# Patient Record
Sex: Male | Born: 1975 | Race: White | Hispanic: No | Marital: Single | Smoking: Current every day smoker
Health system: Southern US, Community
[De-identification: ages and names within clinical notes are randomized; demographics above are authoritative.]

## PROBLEM LIST (undated history)

## (undated) ENCOUNTER — Emergency Department (HOSPITAL_BASED_OUTPATIENT_CLINIC_OR_DEPARTMENT_OTHER): Discharge status: Against Medical Advice | Payer: Managed Care, Other (non HMO)

## (undated) DIAGNOSIS — G43909 Migraine, unspecified, not intractable, without status migrainosus: Secondary | ICD-10-CM

## (undated) DIAGNOSIS — G44009 Cluster headache syndrome, unspecified, not intractable: Secondary | ICD-10-CM

## (undated) HISTORY — PX: OTHER SURGICAL HISTORY: SHX169

## (undated) HISTORY — PX: LEG SURGERY: SHX1003

## (undated) HISTORY — PX: KNEE SURGERY: SHX244

## (undated) HISTORY — PX: TONSILLECTOMY: SUR1361

---

## 2009-12-28 ENCOUNTER — Emergency Department (HOSPITAL_COMMUNITY): Admission: EM | Admit: 2009-12-28 | Discharge: 2009-12-29 | Payer: Self-pay | Admitting: Emergency Medicine

## 2015-09-17 ENCOUNTER — Emergency Department (HOSPITAL_BASED_OUTPATIENT_CLINIC_OR_DEPARTMENT_OTHER)
Admission: EM | Admit: 2015-09-17 | Discharge: 2015-09-18 | Disposition: A | Discharge status: Home / Self Care | Payer: Managed Care, Other (non HMO) | Attending: Emergency Medicine | Admitting: Emergency Medicine

## 2015-09-17 ENCOUNTER — Encounter (HOSPITAL_BASED_OUTPATIENT_CLINIC_OR_DEPARTMENT_OTHER): Payer: Self-pay

## 2015-09-17 DIAGNOSIS — R51 Headache: Secondary | ICD-10-CM | POA: Diagnosis present

## 2015-09-17 DIAGNOSIS — G44019 Episodic cluster headache, not intractable: Secondary | ICD-10-CM

## 2015-09-17 DIAGNOSIS — Z720 Tobacco use: Secondary | ICD-10-CM | POA: Diagnosis not present

## 2015-09-17 DIAGNOSIS — Z79899 Other long term (current) drug therapy: Secondary | ICD-10-CM | POA: Insufficient documentation

## 2015-09-17 HISTORY — DX: Cluster headache syndrome, unspecified, not intractable: G44.009

## 2015-09-17 MED ORDER — SODIUM CHLORIDE 0.9 % IV BOLUS (SEPSIS)
1000.0000 mL | Freq: Once | INTRAVENOUS | Status: AC
  Administered 2015-09-17: 1000 mL via INTRAVENOUS

## 2015-09-17 MED ORDER — DIPHENHYDRAMINE HCL 50 MG/ML IJ SOLN
25.0000 mg | Freq: Once | INTRAMUSCULAR | Status: AC
Start: 2015-09-17 — End: 2015-09-17
  Administered 2015-09-17: 25 mg via INTRAVENOUS
  Filled 2015-09-17: qty 1

## 2015-09-17 MED ORDER — DIAZEPAM 5 MG/ML IJ SOLN
5.0000 mg | Freq: Once | INTRAMUSCULAR | Status: AC
  Administered 2015-09-17: 5 mg via INTRAVENOUS
  Filled 2015-09-17: qty 2

## 2015-09-17 MED ORDER — METOCLOPRAMIDE HCL 5 MG/ML IJ SOLN
10.0000 mg | Freq: Once | INTRAMUSCULAR | Status: AC
  Administered 2015-09-17: 10 mg via INTRAVENOUS
  Filled 2015-09-17: qty 2

## 2015-09-17 MED ORDER — SODIUM CHLORIDE 0.9 % IV SOLN
Freq: Once | INTRAVENOUS | Status: AC
  Administered 2015-09-17: 22:00:00 via INTRAVENOUS

## 2015-09-17 MED ORDER — DEXAMETHASONE SODIUM PHOSPHATE 10 MG/ML IJ SOLN
10.0000 mg | Freq: Once | INTRAMUSCULAR | Status: AC
  Administered 2015-09-17: 10 mg via INTRAVENOUS
  Filled 2015-09-17: qty 1

## 2015-09-17 NOTE — ED Provider Notes (Signed)
CSN: [id number]     Arrival date & time 09/17/15  2112 History   First MD Initiated Contact with Patient 09/17/15 2258     Chief Complaint  Patient presents with  . Headache     (Consider location/radiation/quality/duration/timing/severity/associated sxs/prior Treatment) HPI is a 39 year old male with a history of cluster headaches. He has been seen multiple hospitals in the past several days for same. He was seen earlier this evening at [facility]. The note from that visit indicates that he was told upfront that he would not be receiving narcotics and he agreed. The note indicates he subsequently became belligerent and verbally abusive with staff when he was denied Dilaudid. Since arrival here has his been belligerent with our staff demanding Dilaudid and complaining that he has had to wait. He was also seen earlier today at the Hospital in [place]. He was not given narcotics there either.  His headache began yesterday evening. It is located above his left eye and well localized. The pain is severe and consistent with previous cluster headaches. Is been associated with watering of the left eye but no photophobia. He has had nausea but no vomiting. He has taken Toradol and Imitrex without relief. He refuses subcutaneous Imitrex because "I don't like the way it makes me feel".    Past Medical History  Diagnosis Date  . Headaches, cluster    Past Surgical History  Procedure Laterality Date  . Knee surgry    . Tonsillectomy     No family history on file. Social History  Substance Use Topics  . Smoking status: Current Every Day Smoker  . Smokeless tobacco: None  . Alcohol Use: Yes     Comment: social    Review of Systems  All other systems reviewed and are negative.   Allergies  Vicodin  Home Medications   Prior to Admission medications   Medication Sig Start Date End Date Taking? Authorizing Nimah Uphoff  buPROPion (WELLBUTRIN) 75 MG tablet Take 75 mg by mouth 2 (two)  times daily.   Yes Historical Natara Monfort, MD  ketorolac (TORADOL) 10 MG tablet Take 10 mg by mouth every 6 (six) hours as needed.   Yes Historical Eather Chaires, MD  SUMAtriptan (IMITREX) 50 MG tablet Take 100 mg by mouth every 2 (two) hours as needed for migraine. May repeat in 2 hours if headache persists or recurs.   Yes Historical Cherlyn Syring, MD   BP 133/96 mmHg  Pulse 79  Temp(Src) 97.6 F (36.4 C) (Oral)  Resp 18  Ht  (1.88 m)  Wt 167 lb (75.751 kg)  BMI 21.43 kg/m2  SpO2 100%   Physical Exam  General: Well-developed, well-nourished male in no acute distress; appearance consistent with age of record HENT: normocephalic; atraumatic Eyes: pupils equal, round and reactive to light; extraocular muscles intact Neck: supple Heart: regular rate and rhythm Lungs: clear to auscultation bilaterally Abdomen: soft; nondistended Extremities: No deformity; full range of motion Neurologic: Awake, alert and oriented; motor function intact in all extremities and symmetric; no facial droop Skin: Warm and dry Psychiatric: Argumentative but not threatening    ED Course  Procedures (including critical care time)   MDM  1:13 AM Patient sleeping comfortably. Rates pain 3 of 10.  Paula Libra, MD 09/18/15 [phone]

## 2015-09-17 NOTE — ED Notes (Signed)
EDP and CN remain with pt, at [facility].  [place] PD present outside of room.

## 2015-09-17 NOTE — ED Notes (Signed)
Pt c/o cluster headaches but uncontrollable in the past 5days, was seen at [facility] x2 for the same and the keep returning; states went to [facility] and there cocktail knocked it out completely (valium, decadron, dilaudid, benadryl)

## 2015-09-17 NOTE — ED Notes (Signed)
C/o HA, onset last night, eased off, worse today, gradually progressively worse throughout the day, takes imitrex, sees Dr. Adella Hare, reports "have been given IVF and O2 today at other locations and various unhelpful HA cocktails that have not been what usually works". Alert, NAD< calm, interactive, VSS. Not sensitive to light.

## 2015-09-17 NOTE — ED Notes (Signed)
Pts family member came out of room and stated to nurse that somebody better go in there and see him pretty soon, and she went out of the ED.  I went in the room to see pt and he said he was ready to "get all this stuff off and go home."  Pt expressed frustration that he has not seen a doctor yet and has not gotten "the medicine that I know works". Pt sts "everybody wants to try something else but I know what works."  Pt requesting to know when the doctor is coming in to see him.  Informed him that I cannot tell him that.  Pt has decided to stay at this time.

## 2015-09-17 NOTE — ED Notes (Signed)
Dr. Read Drivers into room with CN. [place] [employer] outside of room, security present.

## 2015-09-18 NOTE — Discharge Instructions (Signed)
Cluster Headache Cluster headaches are recognized by their pattern of deep, intense head pain. They normally occur on one side of your head, but they may "switch sides" in subsequent episodes. Typically, cluster headaches:   Are severe in nature.   Occur repeatedly over weeks to months and are followed by periods of no headaches.   Can last from 15 minutes to 3 hours.   Occur at the same time each day, often at night.   Occur several times a day. CAUSES The exact cause of cluster headaches is not known. Alcohol use may be associated with cluster headaches. SIGNS AND SYMPTOMS   Severe pain that begins in or around your eye or temple.   One-sided head pain.   Feeling sick to your stomach (nauseous).   Sensitivity to light.   Runny nose.   Eye redness, tearing, and nasal stuffiness on the side of your head where you are experiencing pain.   Sweaty, pale skin of the face.   Droopy or swollen eyelid.   Restlessness. DIAGNOSIS  Cluster headaches are diagnosed based on symptoms and a physical exam. Your health care provider may order a CT scan or an MRI of your head or lab tests to see if your headaches are caused by other medical conditions.  TREATMENT   Medicines for pain relief and to prevent recurrent attacks. Some people may need a combination of medicines.  Oxygen for pain relief.   Biofeedback programs to help reduce headache pain.  It may be helpful to keep a headache diary. This may help you find a trend for what is triggering your headaches. Your health care provider can develop a treatment plan.  HOME CARE INSTRUCTIONS  During cluster periods:   Follow a regular sleep schedule. Do not vary the amount and time that you sleep from day to day. It is important to stay on the same schedule during a cluster period to help prevent headaches.   Avoid alcohol.   Stop smoking if you smoke.  SEEK MEDICAL CARE IF:  You have any changes from your previous  cluster headaches either in intensity or frequency.   You are not getting relief from medicines you are taking.  SEEK IMMEDIATE MEDICAL CARE IF:   You faint.   You have weakness or numbness, especially on one side of your body or face.   You have double vision.   You have nausea or vomiting that is not relieved within several hours.   You cannot keep your balance or have difficulty talking or walking.   You have neck pain or stiffness.   You have a fever. MAKE SURE YOU:  Understand these instructions.   Will watch your condition.   Will get help right away if you are not doing well or get worse. Document Released: 12/09/2005 Document Revised: 09/29/2013 Document Reviewed: 07/01/2013 Exit[employer] Patient Information 2015 [employer]. This information is not intended to replace advice given to you by your health care provider. Make sure you discuss any questions you have with your health care provider.  

## 2017-02-16 ENCOUNTER — Emergency Department (HOSPITAL_BASED_OUTPATIENT_CLINIC_OR_DEPARTMENT_OTHER)
Admission: EM | Admit: 2017-02-16 | Discharge: 2017-02-16 | Disposition: A | Discharge status: Home / Self Care | Payer: Self-pay | Attending: Emergency Medicine | Admitting: Emergency Medicine

## 2017-02-16 ENCOUNTER — Encounter (HOSPITAL_BASED_OUTPATIENT_CLINIC_OR_DEPARTMENT_OTHER): Payer: Self-pay | Admitting: Emergency Medicine

## 2017-02-16 ENCOUNTER — Emergency Department (HOSPITAL_BASED_OUTPATIENT_CLINIC_OR_DEPARTMENT_OTHER): Payer: Self-pay

## 2017-02-16 DIAGNOSIS — Z791 Long term (current) use of non-steroidal anti-inflammatories (NSAID): Secondary | ICD-10-CM | POA: Insufficient documentation

## 2017-02-16 DIAGNOSIS — R0789 Other chest pain: Secondary | ICD-10-CM

## 2017-02-16 DIAGNOSIS — F17210 Nicotine dependence, cigarettes, uncomplicated: Secondary | ICD-10-CM | POA: Insufficient documentation

## 2017-02-16 LAB — COMPREHENSIVE METABOLIC PANEL
ALK PHOS: 118 U/L (ref 38–126)
ALT: 345 U/L — ABNORMAL HIGH (ref 17–63)
AST: 158 U/L — AB (ref 15–41)
Albumin: 3.6 g/dL (ref 3.5–5.0)
Anion gap: 10 (ref 5–15)
BILIRUBIN TOTAL: 0.9 mg/dL (ref 0.3–1.2)
BUN: 11 mg/dL (ref 6–20)
CO2: 24 mmol/L (ref 22–32)
CREATININE: 1.32 mg/dL — AB (ref 0.61–1.24)
Calcium: 8.8 mg/dL — ABNORMAL LOW (ref 8.9–10.3)
Chloride: 102 mmol/L (ref 101–111)
GFR calc Af Amer: >60 mL/min (ref >60)
GFR calc non Af Amer: >60 mL/min (ref >60)
GLUCOSE: 115 mg/dL — AB (ref 65–99)
Potassium: 3.7 mmol/L (ref 3.5–5.1)
Sodium: 136 mmol/L (ref 135–145)
TOTAL PROTEIN: 7.5 g/dL (ref 6.5–8.1)

## 2017-02-16 LAB — CBC WITH DIFFERENTIAL/PLATELET
BASOS ABS: 0 10*3/uL (ref 0.0–0.1)
Basophils Relative: 0 %
EOS PCT: 1 %
Eosinophils Absolute: 0.1 10*3/uL (ref 0.0–0.7)
HCT: 44.7 % (ref 39.0–52.0)
Hemoglobin: 15.5 g/dL (ref 13.0–17.0)
Lymphocytes Relative: 21 %
Lymphs Abs: 2 10*3/uL (ref 0.7–4.0)
MCH: 34.2 pg — ABNORMAL HIGH (ref 26.0–34.0)
MCHC: 34.7 g/dL (ref 30.0–36.0)
MCV: 98.7 fL (ref 78.0–100.0)
MONO ABS: 1 10*3/uL (ref 0.1–1.0)
Monocytes Relative: 10 %
Neutro Abs: 6.4 10*3/uL (ref 1.7–7.7)
Neutrophils Relative %: 68 %
PLATELETS: 250 10*3/uL (ref 150–400)
RBC: 4.53 MIL/uL (ref 4.22–5.81)
RDW: 12.8 % (ref 11.5–15.5)
WBC: 9.4 10*3/uL (ref 4.0–10.5)

## 2017-02-16 LAB — TROPONIN I: Troponin I: <0.03 ng/mL (ref <0.03)

## 2017-02-16 MED ORDER — HYDROMORPHONE HCL 1 MG/ML IJ SOLN
1.0000 mg | Freq: Once | INTRAMUSCULAR | Status: AC
  Administered 2017-02-16: 1 mg via INTRAVENOUS
  Filled 2017-02-16: qty 1

## 2017-02-16 MED ORDER — CYCLOBENZAPRINE HCL 10 MG PO TABS: 10.0000 mg | ORAL_TABLET | Freq: Two times a day (BID) | ORAL | 0 refills | Status: AC | PRN

## 2017-02-16 MED ORDER — DICLOFENAC SODIUM 50 MG PO TBEC: 50.0000 mg | DELAYED_RELEASE_TABLET | Freq: Two times a day (BID) | ORAL | 0 refills | Status: AC

## 2017-02-16 MED ORDER — ONDANSETRON HCL 4 MG/2ML IJ SOLN
4.0000 mg | Freq: Once | INTRAMUSCULAR | Status: AC
  Administered 2017-02-16: 4 mg via INTRAVENOUS
  Filled 2017-02-16: qty 2

## 2017-02-16 MED ORDER — CYCLOBENZAPRINE HCL 5 MG PO TABS
5.0000 mg | ORAL_TABLET | Freq: Once | ORAL | Status: AC
  Administered 2017-02-16: 5 mg via ORAL
  Filled 2017-02-16: qty 1

## 2017-02-16 MED ORDER — KETOROLAC TROMETHAMINE 15 MG/ML IJ SOLN
15.0000 mg | Freq: Once | INTRAMUSCULAR | Status: AC
  Administered 2017-02-16: 15 mg via INTRAVENOUS
  Filled 2017-02-16: qty 1

## 2017-02-16 NOTE — Discharge Instructions (Signed)
Your chest x-ray and study to look at your heart are normal.  Your live function study may be elevated due to the alcohol you drink. You need to see your family doctor for follow up of your elevated liver enzymes and your chest wall pain. Take the medications as directed. The muscle relaxant can make you sleepy so do not drive or do any activity that may cause injury while taking the medication.   If you do not have a primary care doctor I have given you the name of one of our doctors here for follow up.

## 2017-02-16 NOTE — ED Triage Notes (Signed)
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 SOB

## 2017-02-16 NOTE — ED Notes (Signed)
Hope, NP in room with pt now.

## 2017-02-16 NOTE — ED Notes (Signed)
Pt on cardiac monitor and automatic VS 

## 2017-02-16 NOTE — ED Provider Notes (Signed)
[facility]   CSN: [id number] Arrival date & time: 02/16/17  1332     History   Chief Complaint Chief Complaint  Patient presents with  . Chest Pain    HPI Eric Meyers is a 41 y.o. male who presents to the ED with chest pain. 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 After working 2 days ago he came home and went to bed. He reports waking yesterday with left side chest and rib pain. The pain increases with deep breath and movement. He has taken nothing for pain. Patient reports that when the pain gets bad he gets nauseated and vomits. He smokes a pack per day. He drinks alcohol every day. Patient states "I drink more than I should".  HPI  Past Medical History:  Diagnosis Date  . Headaches, cluster     There are no active problems to display for this patient.   Past Surgical History:  Procedure Laterality Date  . knee surgry    . TONSILLECTOMY         Home Medications    Prior to Admission medications   Medication Sig Start Date End Date Taking? Authorizing Provider  buPROPion (WELLBUTRIN) 75 MG tablet Take 75 mg by mouth 2 (two) times daily.    Historical Provider, MD  cyclobenzaprine (FLEXERIL) 10 MG tablet Take 1 tablet (10 mg total) by mouth 2 (two) times daily as needed for muscle spasms. 02/16/17   Hope Orlene OchM Neese, NP  diclofenac (VOLTAREN) 50 MG EC tablet Take 1 tablet (50 mg total) by mouth 2 (two) times daily. 02/16/17   Hope Orlene OchM Neese, NP  ketorolac (TORADOL) 10 MG tablet Take 10 mg by mouth every 6 (six) hours as needed.    Historical Provider, MD  SUMAtriptan (IMITREX) 50 MG tablet Take 100 mg by mouth every 2 (two) hours as needed for migraine. May repeat in 2 hours if headache persists or recurs.    Historical Provider, MD    Family History No family history on file.  Social History Social History  Substance Use Topics  . Smoking status: Current Every Day Smoker  . Smokeless tobacco: Never Used  .  Alcohol use Yes     Comment: social     Allergies   Vicodin [hydrocodone-acetaminophen]   Review of Systems Review of Systems  Constitutional: Negative for chills and fever.  HENT: Positive for congestion. Negative for ear pain and sore throat.   Eyes: Negative for redness, itching and visual disturbance.  Respiratory: Negative for shortness of breath.        Patient states not short of breath but doesn't want to take a deep breath due to pain.  Gastrointestinal: Positive for nausea and vomiting. Negative for abdominal pain.       N/v due to when the pain is bad  Genitourinary: Negative for dysuria, frequency and hematuria.  Musculoskeletal: Positive for back pain.  Skin: Negative for wound.  Neurological: Negative for dizziness, syncope and headaches.  Psychiatric/Behavioral: Negative for confusion.     Physical Exam Updated Vital Signs BP (!) 140/103   Pulse 90   Temp 98.5 F (36.9 C) (Oral)   Resp 16   Ht 6\' 2"  (1.88 m)   Wt 78 kg   SpO2 100%   BMI 22.08 kg/m   Physical Exam  Constitutional: He is oriented to person, place, and time. He appears well-developed and well-nourished. No distress.  HENT:  Head: Normocephalic and atraumatic.  Eyes:  EOM are normal.  Neck: Normal range of motion. Neck supple.  Cardiovascular: Normal rate.   Pulmonary/Chest: No respiratory distress. He has no wheezes. He has no rales. He exhibits tenderness.    Tender with palpation left anterior ribs.  Abdominal: Soft. Bowel sounds are normal. There is no tenderness.  Musculoskeletal: He exhibits no edema.  Neurological: He is alert and oriented to person, place, and time. No cranial nerve deficit.  Skin: Skin is warm and dry.  Psychiatric: He has a normal mood and affect. His behavior is normal.  Nursing note and vitals reviewed.    ED Treatments / Results  Labs (all labs ordered are listed, but only abnormal results are displayed) Labs Reviewed  COMPREHENSIVE METABOLIC PANEL  - Abnormal; Notable for the following:       Result Value   Glucose, Bld 115 (*)    Creatinine, Ser 1.32 (*)    Calcium 8.8 (*)    AST 158 (*)    ALT 345 (*)    All other components within normal limits  CBC WITH DIFFERENTIAL/PLATELET - Abnormal; Notable for the following:    MCH 34.2 (*)    All other components within normal limits  TROPONIN I    EKG  EKG Interpretation  Date/Time:  Sunday February 16 2017 13:42:50 EST Ventricular Rate:  106 PR Interval:  120 QRS Duration: 94 QT Interval:  318 QTC Calculation: 422 R Axis:   89 Text Interpretation:  Sinus tachycardia Minimal voltage criteria for LVH, may be normal variant Borderline ECG agree. 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 SOB EXAM: CHEST  2 VIEW COMPARISON:  None. FINDINGS: The heart size and mediastinal contours are within normal limits. Both lungs are clear. No pleural effusion or pneumothorax. The visualized skeletal structures are unremarkable. IMPRESSION: No active cardiopulmonary disease. Electronically Signed   By: Amie Portland M.D.   On: 02/16/2017 14:26    Procedures Procedures (including critical care time)  Medications Ordered in ED Medications  ondansetron (ZOFRAN) injection 4 mg (4 mg Intravenous Given 02/16/17 1528)  HYDROmorphone (DILAUDID) injection 1 mg (1 mg Intravenous Given 02/16/17 1528)  ketorolac (TORADOL) 15 MG/ML injection 15 mg (15 mg Intravenous Given 02/16/17 1627)  cyclobenzaprine (FLEXERIL) tablet 5 mg (5 mg Oral Given 02/16/17 1626)     Initial Impression / Assessment and Plan / ED Course  I have reviewed the triage vital signs and the nursing notes.  Pertinent labs & imaging results that were available during my care of the  patient were reviewed by me and considered in my medical decision making (see chart for details).    Final Clinical Impressions(s) / ED Diagnoses  41 y.o. male with left side chest wall pain after moving and lifting heavy equipment stable for d/c with normal EKG, chest x-ray and troponin. Discussed in detail with the patient elevated LFT's and need for follow up. Discussed alcohol use. Patient agrees to f/u.  Final diagnoses:  Chest wall pain    New Prescriptions New Prescriptions   CYCLOBENZAPRINE (FLEXERIL) 10 MG TABLET    Take 1 tablet (10 mg total) by mouth 2 (two) times daily as needed for muscle spasms.   DICLOFENAC (VOLTAREN) 50 MG EC  TABLET    Take 1 tablet (50 mg total) by mouth 2 (two) times daily.     [address] [place], [place] 02/16/17 1638    Arby Barrette, MD 02/17/17 [phone]

## 2017-05-11 ENCOUNTER — Emergency Department (HOSPITAL_BASED_OUTPATIENT_CLINIC_OR_DEPARTMENT_OTHER)
Admission: EM | Admit: 2017-05-11 | Discharge: 2017-05-11 | Disposition: A | Discharge status: Home / Self Care | Payer: Managed Care, Other (non HMO) | Attending: Emergency Medicine | Admitting: Emergency Medicine

## 2017-05-11 ENCOUNTER — Encounter (HOSPITAL_BASED_OUTPATIENT_CLINIC_OR_DEPARTMENT_OTHER): Payer: Self-pay | Admitting: *Deleted

## 2017-05-11 ENCOUNTER — Emergency Department (HOSPITAL_BASED_OUTPATIENT_CLINIC_OR_DEPARTMENT_OTHER): Payer: Managed Care, Other (non HMO)

## 2017-05-11 DIAGNOSIS — Z791 Long term (current) use of non-steroidal anti-inflammatories (NSAID): Secondary | ICD-10-CM | POA: Insufficient documentation

## 2017-05-11 DIAGNOSIS — Y999 Unspecified external cause status: Secondary | ICD-10-CM | POA: Insufficient documentation

## 2017-05-11 DIAGNOSIS — Y929 Unspecified place or not applicable: Secondary | ICD-10-CM | POA: Insufficient documentation

## 2017-05-11 DIAGNOSIS — F17200 Nicotine dependence, unspecified, uncomplicated: Secondary | ICD-10-CM | POA: Insufficient documentation

## 2017-05-11 DIAGNOSIS — W19XXXA Unspecified fall, initial encounter: Secondary | ICD-10-CM

## 2017-05-11 DIAGNOSIS — M25531 Pain in right wrist: Secondary | ICD-10-CM | POA: Insufficient documentation

## 2017-05-11 DIAGNOSIS — M25561 Pain in right knee: Secondary | ICD-10-CM | POA: Insufficient documentation

## 2017-05-11 DIAGNOSIS — W010XXA Fall on same level from slipping, tripping and stumbling without subsequent striking against object, initial encounter: Secondary | ICD-10-CM | POA: Insufficient documentation

## 2017-05-11 DIAGNOSIS — Y9301 Activity, walking, marching and hiking: Secondary | ICD-10-CM | POA: Insufficient documentation

## 2017-05-11 DIAGNOSIS — Z79899 Other long term (current) drug therapy: Secondary | ICD-10-CM | POA: Insufficient documentation

## 2017-05-11 HISTORY — DX: Migraine, unspecified, not intractable, without status migrainosus: G43.909

## 2017-05-11 MED ORDER — NAPROXEN 500 MG PO TABS: 500.0000 mg | ORAL_TABLET | Freq: Two times a day (BID) | ORAL | 0 refills | Status: AC

## 2017-05-11 MED ORDER — OXYCODONE-ACETAMINOPHEN 5-325 MG PO TABS
1.0000 | ORAL_TABLET | Freq: Once | ORAL | Status: AC
  Administered 2017-05-11: 1 via ORAL
  Filled 2017-05-11: qty 1

## 2017-05-11 NOTE — Discharge Instructions (Signed)
Return to the ED with any concerns including increased pain, swelling/discoloration/numbness of fingers or toes on right hand or right foot, or any other alarming symptoms

## 2017-05-11 NOTE — ED Provider Notes (Signed)
[facility] Provider Note   CSN: [id number] Arrival date & time: 05/11/17  [id number]  By signing my name below, I, Eric Meyers, attest that this documentation has been prepared under the direction and in the presence of Eric Meyers, Bassheva Flury, MD. Electronically Signed: Modena JanskyAlbert Meyers, Scribe. 05/11/2017. 7:34 PM.  History   Chief Complaint Chief Complaint  Patient presents with  . Knee Pain  . Wrist Injury   The history is provided by the patient. No language interpreter was used.  Knee Pain   This is a new problem. The current episode started 12 to 24 hours ago. The problem occurs constantly. The problem has not changed since onset.The pain is present in the right wrist and right knee. The pain is moderate.  Wrist Injury     HPI Comments: Eric Meyers is a 41 y.o. male who presents to the Emergency Department complaining of constant moderate right knee/wrist pain that started last night. He states he was hiking in the dark when he stepped on a rock that caused him to fall onto his right knee and extended wrist. No LOC. His pain is exacerbated by movement. 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 Denies any gait problem, or other complaints at this time.   Past Medical History:  Diagnosis Date  . Headaches, cluster   . Migraines     There are no active problems to display for this patient.   Past Surgical History:  Procedure Laterality Date  . knee surgry    . TONSILLECTOMY         Home Medications    Prior to Admission medications   Medication Sig Start Date End Date Taking? Authorizing Provider  buPROPion (WELLBUTRIN) 75 MG tablet Take 75 mg by mouth 2 (two) times daily.    [provider]  cyclobenzaprine (FLEXERIL) 10 MG tablet Take 1 tablet (10 mg total) by mouth 2 (two) times daily as needed for muscle spasms. 02/16/17   Eric Meyers, Eric M, NP  diclofenac (VOLTAREN) 50 MG EC tablet Take 1 tablet (50 mg total) by mouth 2 (two)  times daily. 02/16/17   Eric Meyers, Eric M, NP  ketorolac (TORADOL) 10 MG tablet Take 10 mg by mouth every 6 (six) hours as needed.    [provider]  naproxen (NAPROSYN) 500 MG tablet Take 1 tablet (500 mg total) by mouth 2 (two) times daily. 05/11/17   Eric Meyers, Eric Duque, MD  SUMAtriptan (IMITREX) 50 MG tablet Take 100 mg by mouth every 2 (two) hours as needed for migraine. May repeat in 2 hours if headache persists or recurs.    [provider]    Family History No family history on file.  Social History Social History  Substance Use Topics  . Smoking status: Current Every Day Smoker  . Smokeless tobacco: Never Used  . Alcohol use Yes     Comment: social     Allergies   Vicodin [hydrocodone-acetaminophen]   Review of Systems Review of Systems  Musculoskeletal: Positive for arthralgias and myalgias. Negative for gait problem.  Neurological: Negative for syncope.  ROS reviewed and all otherwise negative except for mentioned in HPI   Physical Exam Updated Vital Signs BP (!) 160/103 (BP Location: Left Arm)   Pulse (!) 102   Temp 98.8 F (37.1 C) (Oral)   Resp 16   Wt 168 lb (76.2 kg)   SpO2 95%   BMI 21.57 kg/Meyers  Vitals reviewed Physical Exam Physical Examination: General appearance -  alert, well appearing, and in no distress Mental status - alert, oriented to person, place, and time Eyes - no conjunctival injection, no scleral icterus Head- NCAT Neck - no midline tenderness to palpation Chest - clear to auscultation, no wheezes, rales or rhonchi, symmetric air entry Heart - normal rate, regular rhythm, normal S1, S2, no murmurs, rubs, clicks or gallops Back exam -no midline tenderness to palpation Neurological - alert, oriented, normal speech, sensation and strength intact distally in right uppper and lower extremity Musculoskeletal - ttp over dorsal/ulnar aspect of right wrist, no anatomic snuffbox tenderness, right hand/fingers distally NVI.  ttp over  medial joint line of right knee, negative anterior drawer sign, no patellar or proximal fibular tenderness, foot/leg is distally NVI. Extremities - peripheral pulses normal, no pedal edema, no clubbing or cyanosis Skin - normal coloration and turgor, no rashes  ED Treatments / Results  DIAGNOSTIC STUDIES: Oxygen Saturation is 95% on RA, normal by my interpretation.    COORDINATION OF CARE: 7:38 PM- Pt advised of plan for treatment and pt agrees.  Labs (all labs ordered are listed, but only abnormal results are displayed) Labs Reviewed - No data to display  EKG  EKG Interpretation None       Radiology No results found.  Procedures Procedures (including critical care time)  Medications Ordered in ED Medications  oxyCODONE-acetaminophen (PERCOCET/ROXICET) 5-325 MG per tablet 1 tablet (1 tablet Oral Given 05/11/17 1951)     Initial Impression / Assessment and Plan / ED Course  I have reviewed the triage vital signs and the nursing notes.  Pertinent labs & imaging results that were available during my care of the patient were reviewed by me and considered in my medical decision making (see chart for details).     Pt presenting with pain in right wrist and right knee after fall.  xrays of wrist and knee show no fracture.  Due to bony point tenderness will place in knee immobilizer and wrist splint and have patient follow up with ortho for further management and re-evaluation.  Pt given pain meds in the ED and discharged with rx for anti-inflammatories.  Discharged with strict return precautions.  Pt agreeable with plan.  Final Clinical Impressions(s) / ED Diagnoses   Final diagnoses:  Right knee pain, unspecified chronicity  Right wrist pain  Fall, initial encounter    New Prescriptions Discharge Medication List as of 05/11/2017  7:56 PM    START taking these medications   Details  naproxen (NAPROSYN) 500 MG tablet Take 1 tablet (500 mg total) by mouth 2 (two) times  daily., Starting Sun 05/11/2017, Print       I personally performed the services described in this documentation, which was scribed in my presence. The recorded information has been reviewed and is accurate.      Eric Scott, MD 05/14/17 [phone]

## 2017-05-11 NOTE — ED Triage Notes (Signed)
Pt fell last pm and struck his right knee (has had previous surgery in this knee) and right wrist.  Pt reports pain in both knee and in right wrist.  Pt is ambulatory, has knee sleeve on for support.

## 2017-08-24 ENCOUNTER — Encounter (HOSPITAL_BASED_OUTPATIENT_CLINIC_OR_DEPARTMENT_OTHER): Payer: Self-pay | Admitting: *Deleted

## 2017-08-24 ENCOUNTER — Emergency Department (HOSPITAL_BASED_OUTPATIENT_CLINIC_OR_DEPARTMENT_OTHER)
Admission: EM | Admit: 2017-08-24 | Discharge: 2017-08-24 | Disposition: A | Discharge status: Home / Self Care | Payer: Managed Care, Other (non HMO) | Attending: Emergency Medicine | Admitting: Emergency Medicine

## 2017-08-24 DIAGNOSIS — R0789 Other chest pain: Secondary | ICD-10-CM | POA: Insufficient documentation

## 2017-08-24 DIAGNOSIS — R51 Headache: Secondary | ICD-10-CM | POA: Insufficient documentation

## 2017-08-24 DIAGNOSIS — Z5321 Procedure and treatment not carried out due to patient leaving prior to being seen by health care provider: Secondary | ICD-10-CM | POA: Insufficient documentation

## 2017-08-24 DIAGNOSIS — M549 Dorsalgia, unspecified: Secondary | ICD-10-CM | POA: Insufficient documentation

## 2017-08-24 DIAGNOSIS — M542 Cervicalgia: Secondary | ICD-10-CM | POA: Insufficient documentation

## 2017-08-24 NOTE — ED Notes (Signed)
aclled x 3 - no longer seen in waiting room

## 2017-08-24 NOTE — ED Notes (Signed)
Called x1

## 2017-08-24 NOTE — ED Notes (Signed)
Called x2
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
## 2018-07-28 ENCOUNTER — Emergency Department (HOSPITAL_BASED_OUTPATIENT_CLINIC_OR_DEPARTMENT_OTHER)
Admission: EM | Admit: 2018-07-28 | Discharge: 2018-07-28 | Disposition: A | Discharge status: Home / Self Care | Payer: Managed Care, Other (non HMO) | Attending: Emergency Medicine | Admitting: Emergency Medicine

## 2018-07-28 ENCOUNTER — Other Ambulatory Visit: Payer: Self-pay

## 2018-07-28 ENCOUNTER — Encounter (HOSPITAL_BASED_OUTPATIENT_CLINIC_OR_DEPARTMENT_OTHER): Payer: Self-pay

## 2018-07-28 ENCOUNTER — Emergency Department (HOSPITAL_BASED_OUTPATIENT_CLINIC_OR_DEPARTMENT_OTHER): Payer: Managed Care, Other (non HMO)

## 2018-07-28 DIAGNOSIS — M79671 Pain in right foot: Secondary | ICD-10-CM

## 2018-07-28 DIAGNOSIS — F17210 Nicotine dependence, cigarettes, uncomplicated: Secondary | ICD-10-CM | POA: Insufficient documentation

## 2018-07-28 MED ORDER — INDOMETHACIN 50 MG PO CAPS: 50.0000 mg | ORAL_CAPSULE | Freq: Two times a day (BID) | ORAL | 0 refills | Status: AC

## 2018-07-28 MED ORDER — TRAMADOL HCL 50 MG PO TABS: 50.0000 mg | ORAL_TABLET | Freq: Four times a day (QID) | ORAL | 0 refills | Status: AC | PRN

## 2018-07-28 MED ORDER — PREDNISONE 20 MG PO TABS: 40.0000 mg | ORAL_TABLET | Freq: Every day | ORAL | 0 refills | Status: AC

## 2018-07-28 MED FILL — traMADol HCL 50 MG TABS: 50 | 3 days supply | Qty: 12 | Fill #0

## 2018-07-28 MED FILL — INDOMETHACIN 50 MG CAPSULE: 50 | 5 days supply | Qty: 10 | Fill #0

## 2018-07-28 MED FILL — predniSONE 20 MG TABS: 20 | 5 days supply | Qty: 10 | Fill #0

## 2018-07-28 NOTE — ED Notes (Signed)
ED Provider at bedside. 
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
## 2018-07-28 NOTE — ED Notes (Signed)
Pain to right joint of his great toe

## 2018-07-28 NOTE — ED Notes (Signed)
Pt teaching provided on medications that may cause drowsiness. Pt instructed not to drive or operate heavy machinery while taking the prescribed medication. Pt verbalized understanding.   

## 2018-07-28 NOTE — ED Provider Notes (Signed)
Emergency Department Provider Note   I have reviewed the triage vital signs and the nursing notes.   HISTORY  Chief Complaint Foot Pain   HPI Eric Meyers is a 42 y.o. male presents to the ed with right foot pain. Patient with 1-2 weeks of pain. Initially started as pain and redness in the MTP on the right. He has been to an outside ED twice with Rx for NSAIDs with no relief. No fever or chills. No new injury. He went back to work but had increased pain and had to leave early. No radiation or symptoms or modifying factors.    Past Medical History:  Diagnosis Date  . Headaches, cluster   . Migraines     There are no active problems to display for this patient.   Past Surgical History:  Procedure Laterality Date  . KNEE SURGERY    . knee surgry    . LEG SURGERY    . TONSILLECTOMY      Allergies Patient has no active allergies.  No family history on file.  Social History Social History   Tobacco Use  . Smoking status: Current Every Day Smoker    Types: Cigarettes  . Smokeless tobacco: Never Used  Substance Use Topics  . Alcohol use: Not Currently  . Drug use: No    Review of Systems  Constitutional: No fever/chills Musculoskeletal: Negative for back pain. Positive foot pain.  Skin: Negative for rash. Neurological: Negative for headaches, focal weakness or numbness.   10-point ROS otherwise negative.  ____________________________________________   PHYSICAL EXAM:  VITAL SIGNS: ED Triage Vitals  Enc Vitals Group     BP 07/28/18 1153 (!) 145/92     Pulse Rate 07/28/18 1153 96     Resp 07/28/18 1153 20     Temp 07/28/18 1153 98.3 F (36.8 C)     Temp Source 07/28/18 1153 Oral     SpO2 07/28/18 1153 100 %     Weight 07/28/18 1153 170 lb (77.1 kg)     Height 07/28/18 1153 6\' 2"  (1.88 m)     Pain Score 07/28/18 1159 3   Constitutional: Alert and oriented. Well appearing and in no acute distress. Eyes: Conjunctivae are normal.  Head:  Atraumatic. Nose: No congestion/rhinnorhea. Mouth/Throat: Mucous membranes are moist.  Neck: No stridor.   Cardiovascular:  Good peripheral circulation. Respiratory: Normal respiratory effort.   Gastrointestinal: No distention.  Musculoskeletal: No lower extremity tenderness nor edema. No gross deformities of extremities. Mild tenderness over the 1st MTP joint. No redness or warmth. No cellulitis.  Neurologic:  Normal speech and language. No gross focal neurologic deficits are appreciated.  Skin:  Skin is warm, dry and intact. No rash noted.  ____________________________________________  RADIOLOGY  Dg Foot Complete Right  Result Date: 07/28/2018 CLINICAL DATA:  Pt rolled a log over his foot 2 weeks ago,pain and swelling across the top of his foot,pt went to another hospital twice and they took xray's and gave him anti-inflammatories and muscle relaxants. Still has pain across the top of the foot. EXAM: RIGHT FOOT COMPLETE - 3+ VIEW COMPARISON:  07/16/2018 FINDINGS: There is no evidence of fracture or dislocation. There is no evidence of arthropathy or other focal bone abnormality. Soft tissues are unremarkable. IMPRESSION: Negative. Electronically Signed   By: Norva PavlovElizabeth  Brown M.D.   On: 07/28/2018 12:39    ____________________________________________   PROCEDURES  Procedure(s) performed:   Procedures  None ____________________________________________   INITIAL IMPRESSION / ASSESSMENT AND PLAN /  ED COURSE  Pertinent labs & imaging results that were available during my care of the patient were reviewed by me and considered in my medical decision making (see chart for details).  Patient with right toe/foot pain. Plain film with no evidence of fracture. Plan for indomethacin, steroid, and pain medication. Plan for podiatry f/u.   At this time, I do not feel there is any life-threatening condition present. I have reviewed and discussed all results (EKG, imaging, lab, urine as  appropriate), exam findings with patient. I have reviewed nursing notes and appropriate previous records.  I feel the patient is safe to be discharged home without further emergent workup. Discussed usual and customary return precautions. Patient and family (if present) verbalize understanding and are comfortable with this plan.  Patient will follow-up with their primary care provider. If they do not have a primary care provider, information for follow-up has been provided to them. All questions have been answered.  ____________________________________________  FINAL CLINICAL IMPRESSION(S) / ED DIAGNOSES  Final diagnoses:  Right foot pain     NEW OUTPATIENT MEDICATIONS STARTED DURING THIS VISIT:  Discharge Medication List as of 07/28/2018  1:17 PM    START taking these medications   Details  indomethacin (INDOCIN) 50 MG capsule Take 1 capsule (50 mg total) by mouth 2 (two) times daily with a meal for 5 days., Starting Tue 07/28/2018, Until Sun 08/02/2018, Print    predniSONE (DELTASONE) 20 MG tablet Take 2 tablets (40 mg total) by mouth daily for 5 days., Starting Tue 07/28/2018, Until Sun 08/02/2018, Print    traMADol (ULTRAM) 50 MG tablet Take 1 tablet (50 mg total) by mouth every 6 (six) hours as needed., Starting Tue 07/28/2018, Print        Note:  This document was prepared using Dragon voice recognition software and may include unintentional dictation errors.  Alona Bene, MD Emergency Medicine    Eric Meyers, Eric Repress, MD 07/28/18 [id number]

## 2018-07-28 NOTE — Discharge Instructions (Signed)
You were seen in the ED with foot pain. This is probably gout related and I am treating you accordingly. Do not drive or operate heavy machines while taking Tramadol. Call the podiatrist listed for further outpatient follow up. Return to the ED with any new or worsening symptoms.

## 2018-10-23 DEATH — deceased

## 2019-04-30 IMAGING — CR DG FOOT COMPLETE 3+V*R*
3 series · 3 of 3 positions shown · non-contrast
Comparison: 07/16/2018

CLINICAL DATA: Pt rolled a log over his foot 2 weeks ago,pain and
swelling across the top of his foot,pt went to another hospital
twice and they took xray's and gave him anti-inflammatories and
muscle relaxants. Still has pain across the top of the foot.

EXAM:
RIGHT FOOT COMPLETE - 3+ VIEW

[t foot ap right]
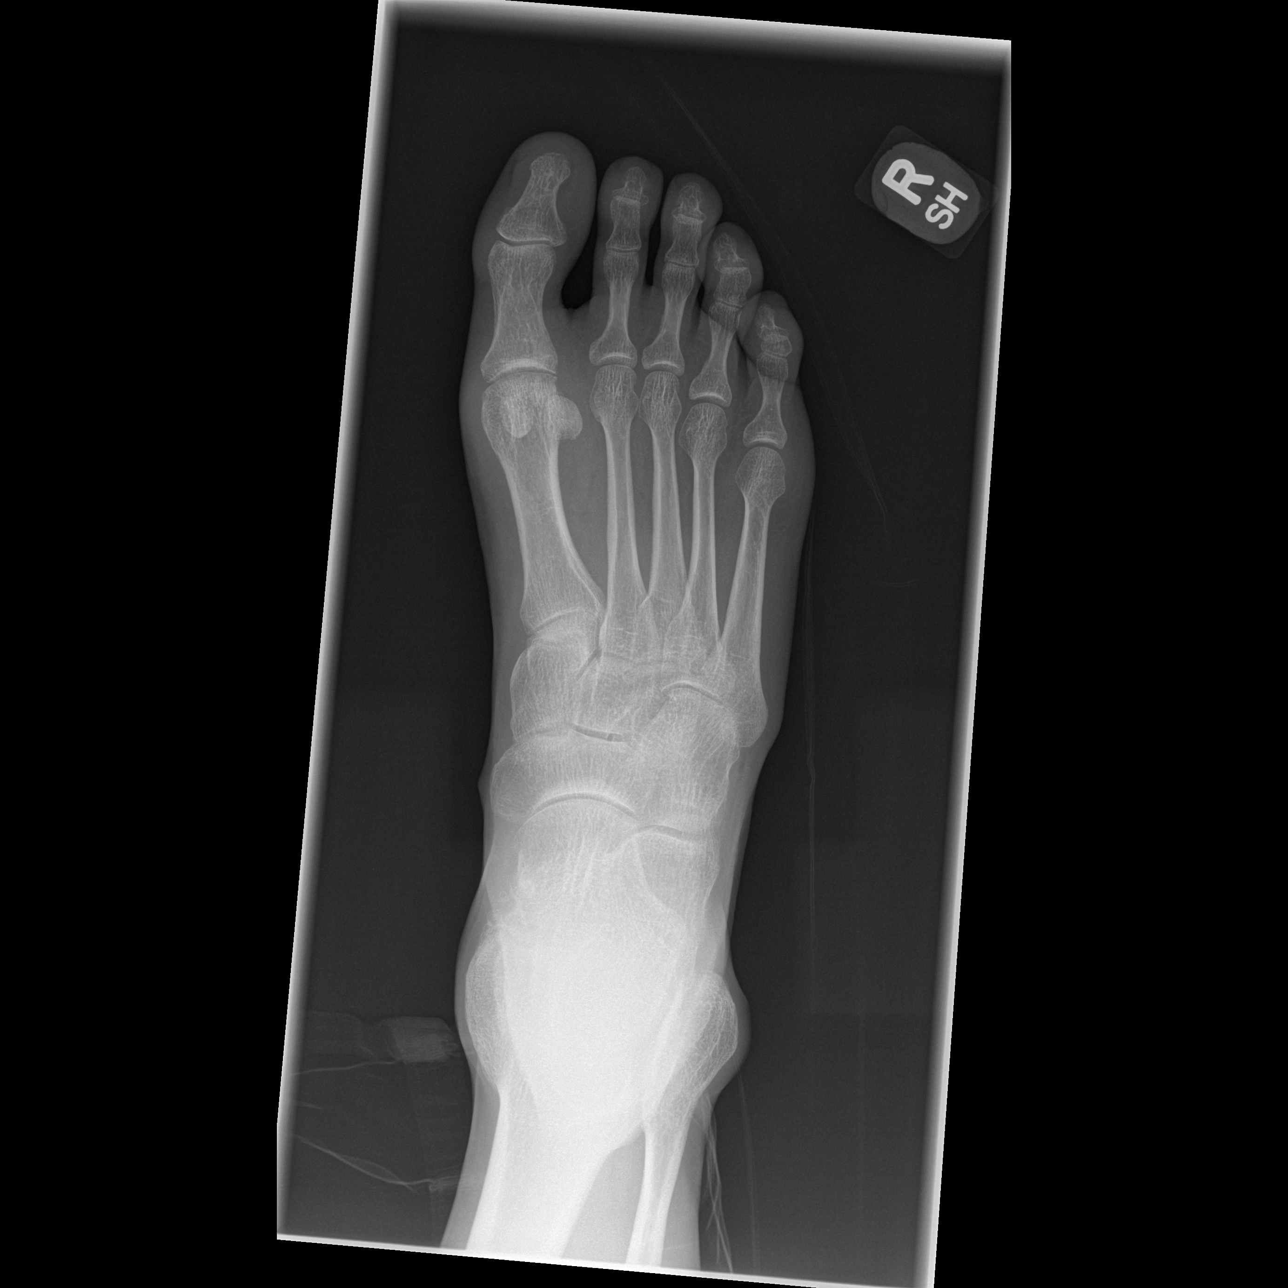

[t foot oblique right]
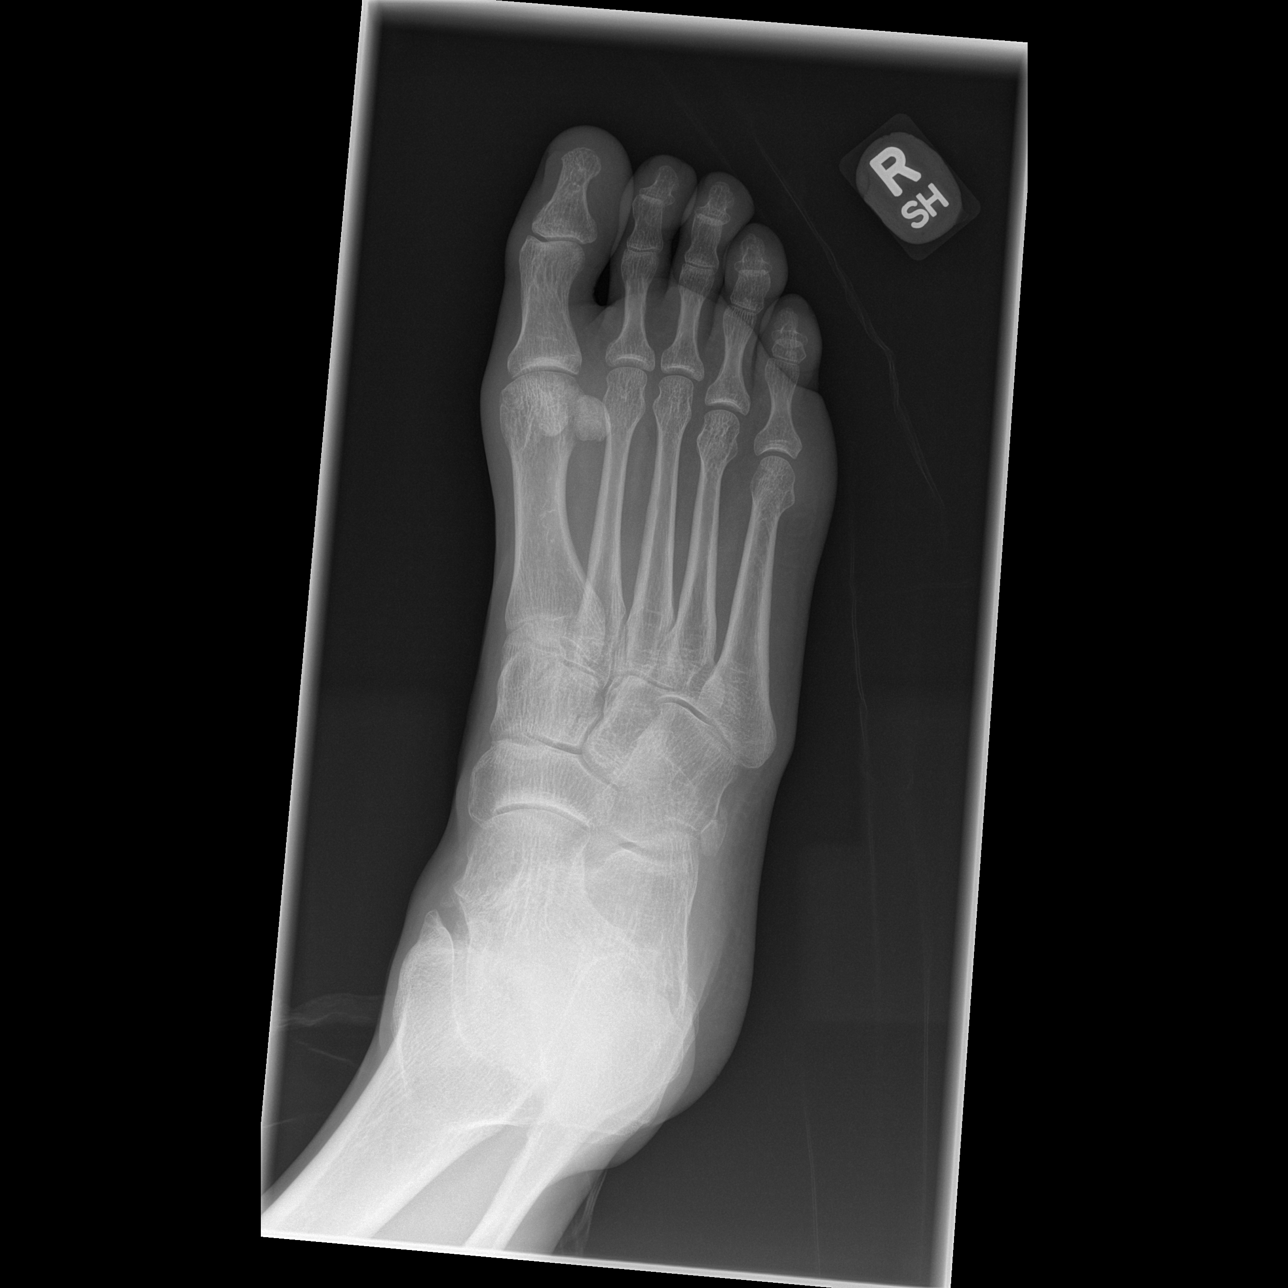

[t foot lat right]
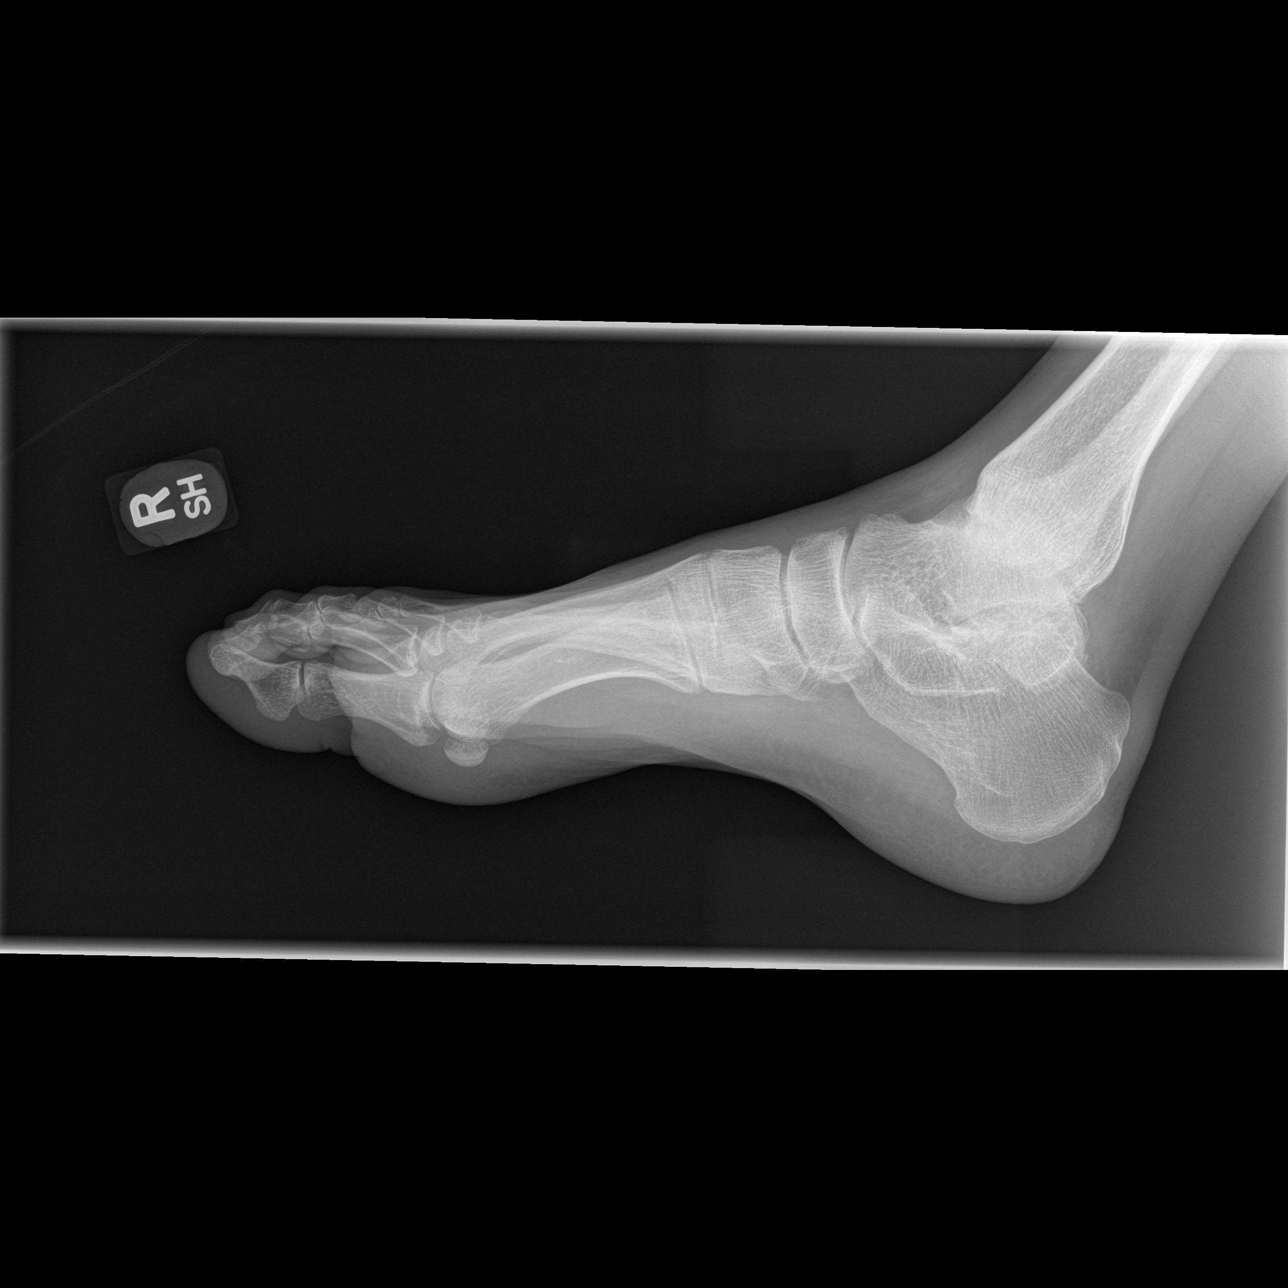

[3 of 3 positions shown; findings below may reference images not displayed]

FINDINGS: There is no evidence of fracture or dislocation. There is no
evidence of arthropathy or other focal bone abnormality. Soft
tissues are unremarkable.
IMPRESSION: Negative.
